# Patient Record
Sex: Female | Born: 1940 | Race: Black or African American | Hispanic: No
Health system: Southern US, Community
[De-identification: ages and names within clinical notes are randomized; demographics above are authoritative.]

---

## 2020-06-17 ENCOUNTER — Other Ambulatory Visit: Payer: Self-pay

## 2020-06-17 ENCOUNTER — Emergency Department (HOSPITAL_COMMUNITY): Payer: Medicare Other

## 2020-06-17 ENCOUNTER — Emergency Department (HOSPITAL_COMMUNITY)
Admission: EM | Admit: 2020-06-17 | Discharge: 2020-06-18 | Disposition: A | Discharge status: Home / Self Care | Payer: Medicare Other | Attending: Emergency Medicine | Admitting: Emergency Medicine

## 2020-06-17 DIAGNOSIS — M7989 Other specified soft tissue disorders: Secondary | ICD-10-CM | POA: Diagnosis present

## 2020-06-17 DIAGNOSIS — R0789 Other chest pain: Secondary | ICD-10-CM | POA: Diagnosis not present

## 2020-06-17 DIAGNOSIS — Z992 Dependence on renal dialysis: Secondary | ICD-10-CM | POA: Diagnosis not present

## 2020-06-17 DIAGNOSIS — Z20822 Contact with and (suspected) exposure to covid-19: Secondary | ICD-10-CM | POA: Insufficient documentation

## 2020-06-17 DIAGNOSIS — R600 Localized edema: Secondary | ICD-10-CM | POA: Diagnosis not present

## 2020-06-17 DIAGNOSIS — Z7901 Long term (current) use of anticoagulants: Secondary | ICD-10-CM | POA: Diagnosis not present

## 2020-06-17 DIAGNOSIS — Z79899 Other long term (current) drug therapy: Secondary | ICD-10-CM | POA: Diagnosis not present

## 2020-06-17 DIAGNOSIS — N186 End stage renal disease: Secondary | ICD-10-CM

## 2020-06-17 LAB — BASIC METABOLIC PANEL
Anion gap: 18 — ABNORMAL HIGH (ref 5–15)
BUN: 97 mg/dL — ABNORMAL HIGH (ref 8–23)
CO2: 28 mmol/L (ref 22–32)
Calcium: 9.1 mg/dL (ref 8.9–10.3)
Chloride: 95 mmol/L — ABNORMAL LOW (ref 98–111)
Creatinine, Ser: 11.28 mg/dL — ABNORMAL HIGH (ref 0.44–1.00)
GFR, Estimated: 3 mL/min — ABNORMAL LOW (ref >60)
Glucose, Bld: 111 mg/dL — ABNORMAL HIGH (ref 70–99)
Potassium: 6.2 mmol/L — ABNORMAL HIGH (ref 3.5–5.1)
Sodium: 141 mmol/L (ref 135–145)

## 2020-06-17 LAB — RESP PANEL BY RT-PCR (FLU A&B, COVID) ARPGX2
Influenza A by PCR: NEGATIVE
Influenza B by PCR: NEGATIVE
SARS Coronavirus 2 by RT PCR: NEGATIVE

## 2020-06-17 LAB — BRAIN NATRIURETIC PEPTIDE: B Natriuretic Peptide: 131 pg/mL — ABNORMAL HIGH (ref 0.0–100.0)

## 2020-06-17 LAB — CBC WITH DIFFERENTIAL/PLATELET
Abs Immature Granulocytes: 0.02 10*3/uL (ref 0.00–0.07)
Basophils Absolute: 0.1 10*3/uL (ref 0.0–0.1)
Basophils Relative: 1 %
Eosinophils Absolute: 0.2 10*3/uL (ref 0.0–0.5)
Eosinophils Relative: 3 %
HCT: 31.7 % — ABNORMAL LOW (ref 36.0–46.0)
Hemoglobin: 10.4 g/dL — ABNORMAL LOW (ref 12.0–15.0)
Immature Granulocytes: 0 %
Lymphocytes Relative: 23 %
Lymphs Abs: 1.6 10*3/uL (ref 0.7–4.0)
MCH: 32.6 pg (ref 26.0–34.0)
MCHC: 32.8 g/dL (ref 30.0–36.0)
MCV: 99.4 fL (ref 80.0–100.0)
Monocytes Absolute: 0.6 10*3/uL (ref 0.1–1.0)
Monocytes Relative: 8 %
Neutro Abs: 4.6 10*3/uL (ref 1.7–7.7)
Neutrophils Relative %: 65 %
Platelets: 260 10*3/uL (ref 150–400)
RBC: 3.19 MIL/uL — ABNORMAL LOW (ref 3.87–5.11)
RDW: 14.8 % (ref 11.5–15.5)
WBC: 7 10*3/uL (ref 4.0–10.5)
nRBC: 0 % (ref 0.0–0.2)

## 2020-06-17 LAB — TROPONIN I (HIGH SENSITIVITY)
Troponin I (High Sensitivity): 22 ng/L — ABNORMAL HIGH (ref <18)
Troponin I (High Sensitivity): 24 ng/L — ABNORMAL HIGH (ref <18)

## 2020-06-17 MED ORDER — CHLORHEXIDINE GLUCONATE CLOTH 2 % EX PADS: 6.0000 | MEDICATED_PAD | Freq: Every day | CUTANEOUS | Status: DC

## 2020-06-17 NOTE — Procedures (Signed)
Asked to see out-of-town ESRD patient for SOB and LE edema, missed HD.  Pt is ESRD x 5 yrs , HD in [place] on MWF schedule, last HD was Wed 12/15. She  states she was set up at [facility] HD unit to start Saturday 12/18 and is staying through the 1st week of January. However when she called the [place] HD unit she says they didn't have any record of her supposed to be dialyzing in that spot. She missed HD yesterday therefore. Today the administrators were involved and they have determined that the patient was indeed supposed to start at the [facility] unit but they have backed up the starting date to this Tuesday 12/21.  She was instructed to come to the ED if she problems in the meantime. Pt describes feeling fullness in her chest and legs, mild nausea. Asked to see for dialysis. Pt examined and has mild edema, ++jvd, mild rales L base, R clear. Not in distress, +facial edema. Plan HD tonight w/ 3-4 L UF and discharge then back to ED so they can reassess for dc home.      I was present at this dialysis session, have reviewed the session itself and made  appropriate changes Kelly Splinter MD Jaconita pager [phone]   06/17/2020, 8:59 PM

## 2020-06-17 NOTE — ED Triage Notes (Signed)
Last had dialysis on Wednesday and isn't scheduled for dialysis again until Tuesday. Staff told her if she started to swell to come to ED. Swelling in both knees. +mild chest tightness and shob.

## 2020-06-17 NOTE — ED Provider Notes (Signed)
[facility] DEPT Provider Note   CSN: [id number] Arrival date & time: 06/17/20  4097     History Chief Complaint  Patient presents with  . Leg Swelling    Donna Gonzalez is a 79 y.o. female.  Patient is here because she has missed multiple rounds of dialysis, she is visiting from out of town, normally gets dialysis Monday Wednesday Friday, she had arranged to get Tuesday Thursday Saturday dialysis here, was most recently dialyzed on Wednesday, due to a mixup logistically she did not receive dialysis on Saturday, she is here today because she started to get some swelling in her legs and mild chest tightness.  She denies any fevers recent injuries recent illnesses.  She has been doing well she has been compliant with her medications.        No past medical history on file.  There are no problems to display for this patient.      OB History   No obstetric history on file.     No family history on file.     Home Medications Prior to Admission medications   Medication Sig Start Date End Date Taking? Authorizing Provider  B Complex-C-Folic Acid (TRIPHROCAPS) 1 MG CAPS Take 1 capsule by mouth daily. 03/28/20   [provider]  calcium acetate (PHOSLO) 667 MG capsule Take by mouth. 04/11/20   [provider]  ELIQUIS 2.5 MG TABS tablet Take 2.5 mg by mouth 2 (two) times daily. 05/18/20   [provider]  folic acid (FOLVITE) 1 MG tablet Take 1 mg by mouth daily. 04/30/20   [provider]  hydrALAZINE (APRESOLINE) 50 MG tablet Take 50 mg by mouth 3 (three) times daily. 05/21/20   [provider]  labetalol (NORMODYNE) 100 MG tablet Take 100 mg by mouth 2 (two) times daily. 05/28/20   [provider]  NIFEdipine (ADALAT CC) 60 MG 24 hr tablet Take by mouth. 03/21/20   [provider]  ondansetron (ZOFRAN) 4 MG tablet Take 4 mg by mouth every 8 (eight) hours as needed for nausea/vomiting.  04/13/20   [provider]  pantoprazole (PROTONIX) 40 MG tablet Take 40 mg by mouth 2 (two) times daily. 02/06/20   [provider]    Allergies    Lisinopril and Pioglitazone  Review of Systems   Review of Systems  Constitutional: Negative for chills and fever.  HENT: Negative for congestion and rhinorrhea.   Respiratory: Positive for chest tightness. Negative for cough and shortness of breath.   Cardiovascular: Positive for leg swelling. Negative for chest pain and palpitations.  Gastrointestinal: Negative for diarrhea, nausea and vomiting.  Genitourinary: Negative for difficulty urinating and dysuria.  Musculoskeletal: Negative for arthralgias and back pain.  Skin: Negative for rash and wound.  Neurological: Negative for light-headedness and headaches.    Physical Exam Updated Vital Signs BP (!) 158/59   Pulse (!) 56   Temp 97.8 F (36.6 C) (Oral)   Resp 20   SpO2 93%   Physical Exam Vitals and nursing note reviewed. Exam conducted with a chaperone present.  Constitutional:      General: She is not in acute distress.    Appearance: Normal appearance.  HENT:     Head: Normocephalic and atraumatic.     Nose: No rhinorrhea.  Eyes:     General:        Right eye: No discharge.        Left eye: No discharge.  Conjunctiva/sclera: Conjunctivae normal.  Cardiovascular:     Rate and Rhythm: Regular rhythm. Bradycardia present.  Pulmonary:     Effort: Pulmonary effort is normal. No respiratory distress.     Breath sounds: No stridor. No wheezing, rhonchi or rales.  Abdominal:     General: Abdomen is flat. There is no distension.     Palpations: Abdomen is soft.  Musculoskeletal:        General: No tenderness or signs of injury.     Right lower leg: Edema (mild) present.     Left lower leg: Edema (mild) present.  Skin:    General: Skin is warm and dry.  Neurological:     General: No focal deficit present.     Mental Status: She is alert. Mental  status is at baseline.     Motor: No weakness.  Psychiatric:        Mood and Affect: Mood normal.        Behavior: Behavior normal.     ED Results / Procedures / Treatments   Labs (all labs ordered are listed, but only abnormal results are displayed) Labs Reviewed  CBC WITH DIFFERENTIAL/PLATELET - Abnormal; Notable for the following components:      Result Value   RBC 3.19 (*)    Hemoglobin 10.4 (*)    HCT 31.7 (*)    All other components within normal limits  BASIC METABOLIC PANEL - Abnormal; Notable for the following components:   Potassium 6.2 (*)    Chloride 95 (*)    Glucose, Bld 111 (*)    BUN 97 (*)    Creatinine, Ser 11.28 (*)    GFR, Estimated 3 (*)    Anion gap 18 (*)    All other components within normal limits  BRAIN NATRIURETIC PEPTIDE - Abnormal; Notable for the following components:   B Natriuretic Peptide 131.0 (*)    All other components within normal limits  TROPONIN I (HIGH SENSITIVITY) - Abnormal; Notable for the following components:   Troponin I (High Sensitivity) 22 (*)    All other components within normal limits  RESP PANEL BY RT-PCR (FLU A&B, COVID) ARPGX2  TROPONIN I (HIGH SENSITIVITY)    EKG EKG Interpretation  Date/Time:  Sunday June 17 2020 09:58:28 EST Ventricular Rate:  56 PR Interval:    QRS Duration: 151 QT Interval:  474 QTC Calculation: 458 R Axis:   -48 Text Interpretation: Sinus or ectopic atrial rhythm 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 FINDINGS: The cardiomediastinal silhouette is mildly enlarged in contour.Atherosclerotic calcifications of the aorta. LEFT chest cardiac pacing device. No pleural effusion. No pneumothorax. Perihilar vascular fullness and cephalization of the vasculature without overt  edema. Visualized abdomen is unremarkable. Multilevel degenerative changes of the thoracic spine. IMPRESSION: Findings suggestive of pulmonary vascular congestion without overt edema. Electronically Signed   By: Valentino Saxon MD   On: 06/17/2020 11:04    Procedures Procedures (including critical care time)  Medications Ordered in ED Medications  Chlorhexidine Gluconate Cloth 2 % PADS 6 each (has no administration in time range)    ED Course  I have reviewed the triage vital signs and the nursing notes.  Pertinent labs & imaging results that were available during my care of the patient were reviewed by me and  considered in my medical decision making (see chart for details).    MDM Rules/Calculators/A&P                          Patient with end-stage renal disease comes today for missed dialysis clinic.  Upon initial assessment patient has normal work of breathing has clear lung sounds no signs of volume overload in the lungs, faint to mild pitting edema in the lower extremities, she is breathing in a comfortable rate, she has no signs of hypoxia.  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  She denies any chest pain but does feel a little bit of tightness that she thinks is from missing dialysis sessions, we will get screening labs she will get cardiac biomarkers will get chest x-ray we will touch base with nephrology to see if we can help arrange this patient to get dialyzed.  I reviewed this patient's labs elevated creatinine BUN and potassium, no EKG changes so no temporizing measures needed.  I spoke to nephrologist on-call Dr. Jonnie Finner, Dr. Jonnie Finner agrees this patient does need to be dialyzed, he has space for her at the dialysis center today at 6 PM, he recommends transfer to [facility] where the dialysis center is so the patient  be dialyzed soon as possible and then likely be discharged home.  Family agrees to this plan.  I spoke to the [facility] emergency department, Dr. Alvino Chapel is on-call there agrees to except this patient  Final Clinical Impression(s) / ED Diagnoses Final diagnoses:  ESRD (end stage renal disease) [facility])    Rx / DC Orders ED Discharge Orders    None       Breck Coons, MD 06/17/20 1249

## 2020-06-17 NOTE — ED Triage Notes (Signed)
Pt transferred from [facility] via [facility] for dialysis needed tonight. Pt started feeling really tight in legs and chest today, felt fluid overloaded.   A&Ox4

## 2020-06-17 NOTE — ED Provider Notes (Signed)
[facility] EMERGENCY DEPARTMENT Provider Note   CSN: [id number] Arrival date & time: 06/17/20  2774     History Chief Complaint  Patient presents with  . Leg Swelling    Donna Gonzalez is a 79 y.o. female with history of ESRD on dialysis M/W/F who presents to the ED from [facility] ED for emergent dialysis. Patient is visiting from out of town and missed dialysis on Saturday. Last dialysis session Wednesday. She presented to [facility] ED due to increased lower extremity edema and chest tightness which she attributes to missed dialysis. Denies fever and chills. No recent illness. During my evaluation she has no more chest tightness. Denies history of MI.   History obtained from patient and past medical records. No interpreter used during encounter.      No past medical history on file.  There are no problems to display for this patient.    OB History   No obstetric history on file.     No family history on file.     Home Medications Prior to Admission medications   Medication Sig Start Date End Date Taking? Authorizing Provider  B Complex-C-Folic Acid (TRIPHROCAPS) 1 MG CAPS Take 1 capsule by mouth daily. 03/28/20   [provider]  calcium acetate (PHOSLO) 667 MG capsule Take by mouth. 04/11/20   [provider]  ELIQUIS 2.5 MG TABS tablet Take 2.5 mg by mouth 2 (two) times daily. 05/18/20   [provider]  folic acid (FOLVITE) 1 MG tablet Take 1 mg by mouth daily. 04/30/20   [provider]  hydrALAZINE (APRESOLINE) 50 MG tablet Take 50 mg by mouth 3 (three) times daily. 05/21/20   [provider]  labetalol (NORMODYNE) 100 MG tablet Take 100 mg by mouth 2 (two) times daily. 05/28/20   [provider]  NIFEdipine (ADALAT CC) 60 MG 24 hr tablet Take by mouth. 03/21/20   [provider]  ondansetron (ZOFRAN) 4 MG tablet Take 4 mg by mouth every 8 (eight) hours as needed for nausea/vomiting. 04/13/20    [provider]  pantoprazole (PROTONIX) 40 MG tablet Take 40 mg by mouth 2 (two) times daily. 02/06/20   [provider]    Allergies    Lisinopril and Pioglitazone  Review of Systems   Review of Systems  Constitutional: Negative for chills and fever.  Respiratory: Positive for chest tightness. Negative for shortness of breath.   Cardiovascular: Positive for leg swelling. Negative for palpitations.  Gastrointestinal: Negative for abdominal pain, diarrhea, nausea and vomiting.  All other systems reviewed and are negative.   Physical Exam Updated Vital Signs BP (!) 177/67   Pulse (!) 55   Temp 97.8 F (36.6 C) (Oral)   Resp 20   SpO2 93%   Physical Exam Vitals and nursing note reviewed.  Constitutional:      General: She is not in acute distress.    Appearance: She is not toxic-appearing.  HENT:     Head: Normocephalic.  Eyes:     Pupils: Pupils are equal, round, and reactive to light.  Cardiovascular:     Rate and Rhythm: Regular rhythm. Bradycardia present.     Pulses: Normal pulses.     Heart sounds: Normal heart sounds. No murmur heard. No friction rub. No gallop.   Pulmonary:     Effort: Pulmonary effort is normal.     Breath sounds: Normal breath sounds.  Abdominal:     General: Abdomen is flat. Bowel sounds  are normal. There is no distension.     Palpations: Abdomen is soft.     Tenderness: There is no abdominal tenderness. There is no guarding or rebound.  Musculoskeletal:     Cervical back: Neck supple.     Right lower leg: Edema present.     Left lower leg: Edema present.  Skin:    General: Skin is warm and dry.  Neurological:     General: No focal deficit present.  Psychiatric:        Mood and Affect: Mood normal.        Behavior: Behavior normal.     ED Results / Procedures / Treatments   Labs (all labs ordered are listed, but only abnormal results are displayed) Labs Reviewed  CBC WITH DIFFERENTIAL/PLATELET - Abnormal;  Notable for the following components:      Result Value   RBC 3.19 (*)    Hemoglobin 10.4 (*)    HCT 31.7 (*)    All other components within normal limits  BASIC METABOLIC PANEL - Abnormal; Notable for the following components:   Potassium 6.2 (*)    Chloride 95 (*)    Glucose, Bld 111 (*)    BUN 97 (*)    Creatinine, Ser 11.28 (*)    GFR, Estimated 3 (*)    Anion gap 18 (*)    All other components within normal limits  BRAIN NATRIURETIC PEPTIDE - Abnormal; Notable for the following components:   B Natriuretic Peptide 131.0 (*)    All other components within normal limits  TROPONIN I (HIGH SENSITIVITY) - Abnormal; Notable for the following components:   Troponin I (High Sensitivity) 22 (*)    All other components within normal limits  TROPONIN I (HIGH SENSITIVITY) - Abnormal; Notable for the following components:   Troponin I (High Sensitivity) 24 (*)    All other components within normal limits  RESP PANEL BY RT-PCR (FLU A&B, COVID) ARPGX2    EKG EKG Interpretation  Date/Time:  Sunday June 17 2020 09:58:28 EST Ventricular Rate:  56 PR Interval:    QRS Duration: 151 QT Interval:  474 QTC Calculation: 458 R Axis:   -48 Text Interpretation: Sinus or ectopic atrial rhythm 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 FINDINGS: The cardiomediastinal silhouette is mildly enlarged in contour.Atherosclerotic calcifications of the aorta. LEFT chest cardiac pacing device. No pleural effusion. No pneumothorax. Perihilar vascular fullness and cephalization of the vasculature without overt edema. Visualized abdomen is unremarkable. Multilevel degenerative changes of the thoracic spine. IMPRESSION: Findings suggestive of pulmonary vascular congestion without overt edema.  Electronically Signed   By: Stephanie  Peacock MD   On: 06/17/2020 11:04    Procedures Procedures (including critical care time)  Medications Ordered in ED Medications  Chlorhexidine Gluconate Cloth 2 % PADS 6 each (has no administration in time range)    ED Course  I have reviewed the triage vital signs and the nursing notes.  Pertinent labs & imaging results that were available during my care of the patient were reviewed by me and considered in my medical decision making (see chart for details).    MDM Rules/Calculators/A&P                         79  year old  female presents the ED from [facility] long ED for emergent dialysis. Patient is visiting from out of town. Last dialysis session was on Wednesday. Admits to lower extremity edema and chest tightness earlier this morning which has completely resolved by my evaluation.  Stable vitals with bradycardia.  Patient has a pacemaker and notes this is typical of her heart rate.  Patient in no acute distress and nontoxic appearing.  Physical exam significant for 1+ bilateral lower extremity edema.  Negative calf tenderness.  Negative Homans' sign bilaterally.  Lungs clear to auscultation bilaterally.  I personally reviewed all labs and imaging from previous provider.  Troponins elevated mildly likely due to ESRD. Patient is currently chest pain free. EKG with no signs of acute ischemia. Low suspicion for ACS.  Mildly elevated at 131.  Covid/influenza test negative.  BMP significant for elevated creatinine and BUN likely due to missed dialysis.  Hyperkalemia at 6.2. No EKG changes. CXR personally reviewed which demonstrates: IMPRESSION:  Findings suggestive of pulmonary vascular congestion without overt  edema.   CBC reassuring with no leukocytosis.  Mild anemia with hemoglobin at 10.4 likely due to ESRD.  4:54 PM informed Dr. Jonnie Finner that patient has arrived from [facility]. He will see patient soon. Plan for dialysis around 6PM this evening.    Patient taken to dialysis and will be brought to the ED after for reevaluation. Patient will need to be reassessed for symptomatic relief. Anticipate discharge home after dialysis.  Final Clinical Impression(s) / ED Diagnoses Final diagnoses:  ESRD (end stage renal disease) [facility])    Rx / DC Orders ED Discharge Orders    None       Karie Kirks 06/17/20 2021    Charlesetta Shanks, MD 06/21/20 1129

## 2020-06-18 NOTE — ED Notes (Signed)
Discharge instructions discussed with pt. Pt verbalized understanding. Pt stable and ambulatory. No signature pad available. 

## 2020-06-18 NOTE — ED Provider Notes (Signed)
12:54 AM Assumed care from Drs. Ron Parker and Johnney Killian and PA Aberman, please see their note for full history, physical and decision making until this point. In brief this is a 79 y.o. year old female who presented to the ED tonight with Leg Swelling     Was here with lower extremity edema.  Went to dialysis.  Patient feels better.  Blood pressure still high but she has taking her blood pressure medicines today so will likely get better.  She already has dialysis scheduled for the rest of her stay here in town.  She appears to be stable for discharge on my examination at this time.  No respiratory distress.  Discharge instructions, including strict return precautions for new or worsening symptoms, given. Patient and/or family verbalized understanding and agreement with the plan as described.   Labs, studies and imaging reviewed by myself and considered in medical decision making if ordered. Imaging interpreted by radiology.  Labs Reviewed  CBC WITH DIFFERENTIAL/PLATELET - Abnormal; Notable for the following components:      Result Value   RBC 3.19 (*)    Hemoglobin 10.4 (*)    HCT 31.7 (*)    All other components within normal limits  BASIC METABOLIC PANEL - Abnormal; Notable for the following components:   Potassium 6.2 (*)    Chloride 95 (*)    Glucose, Bld 111 (*)    BUN 97 (*)    Creatinine, Ser 11.28 (*)    GFR, Estimated 3 (*)    Anion gap 18 (*)    All other components within normal limits  BRAIN NATRIURETIC PEPTIDE - Abnormal; Notable for the following components:   B Natriuretic Peptide 131.0 (*)    All other components within normal limits  TROPONIN I (HIGH SENSITIVITY) - Abnormal; Notable for the following components:   Troponin I (High Sensitivity) 22 (*)    All other components within normal limits  TROPONIN I (HIGH SENSITIVITY) - Abnormal; Notable for the following components:   Troponin I (High Sensitivity) 24 (*)    All other components within normal limits  RESP PANEL BY  RT-PCR (FLU A&B, COVID) ARPGX2    DG Chest 2 View  Final Result      No follow-ups on file.    Jaxden Blyden, Corene Cornea, MD 06/18/20 [phone]

## 2022-03-01 IMAGING — CR DG CHEST 2V
2 series · 2 of 2 positions shown · non-contrast
Comparison: None.

CLINICAL DATA: Chest tightness

EXAM:
CHEST - 2 VIEW

[w chest lat]
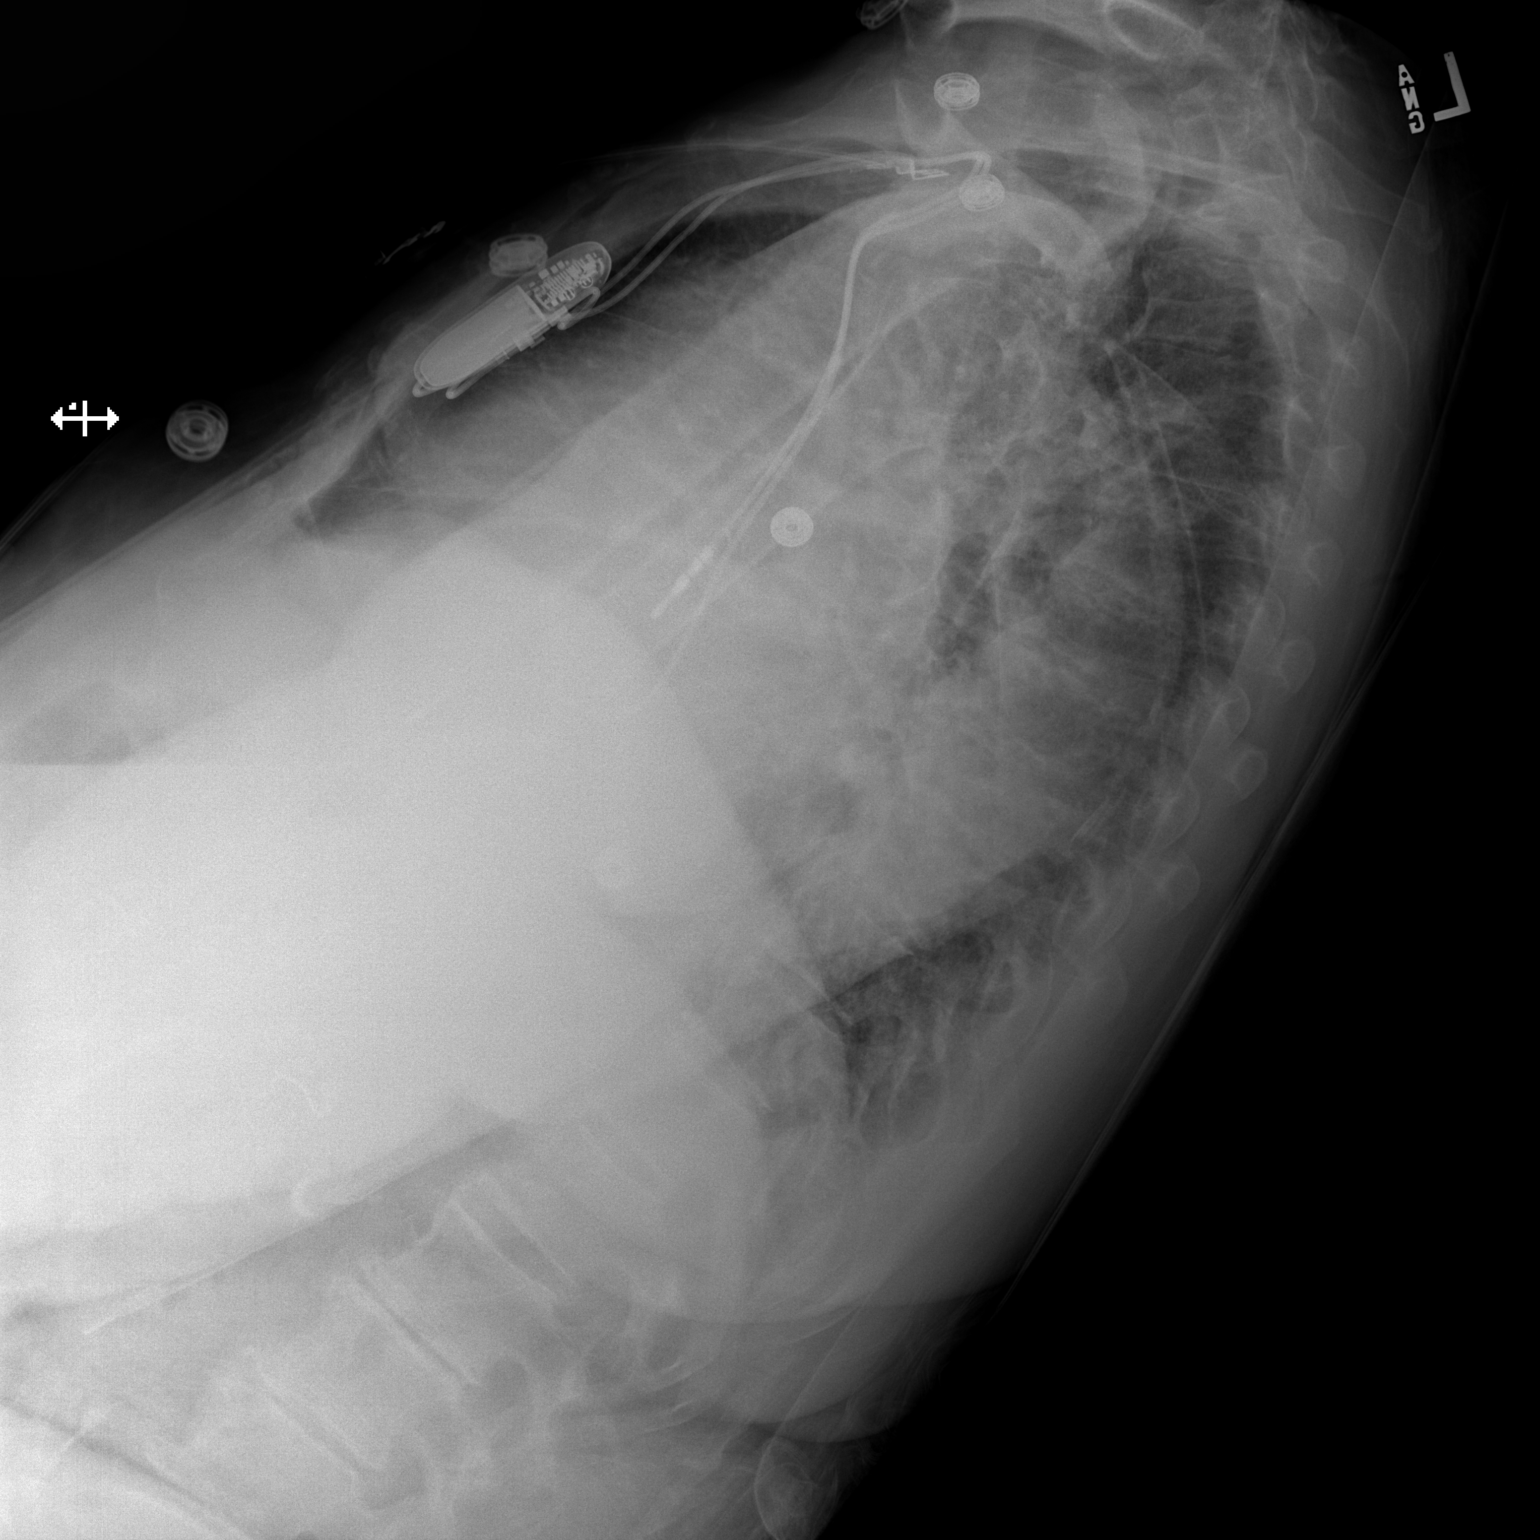

[x chest ap]
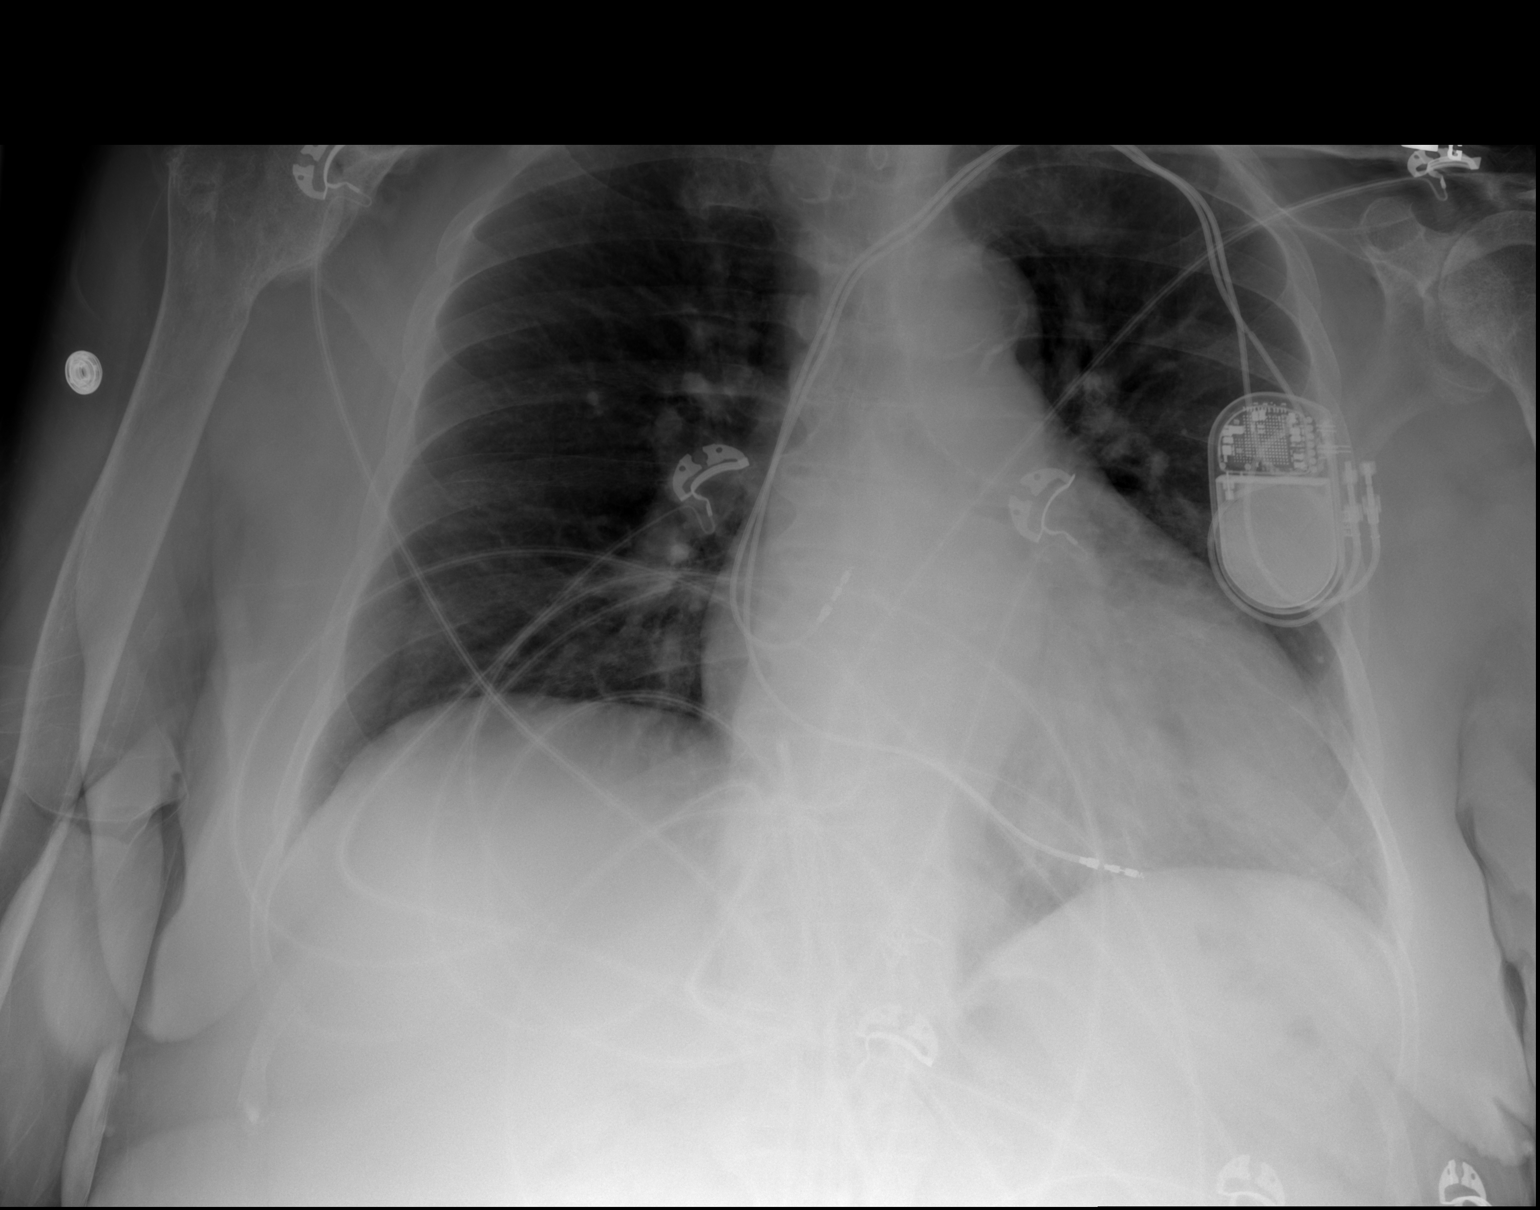

[2 of 2 positions shown; findings below may reference images not displayed]

FINDINGS: The cardiomediastinal silhouette is mildly enlarged in
contour.Atherosclerotic calcifications of the aorta. LEFT chest
cardiac pacing device. No pleural effusion. No pneumothorax.
Perihilar vascular fullness and cephalization of the vasculature
without overt edema. Visualized abdomen is unremarkable. Multilevel
degenerative changes of the thoracic spine.
IMPRESSION: Findings suggestive of pulmonary vascular congestion without overt
edema.

## 2024-04-30 DEATH — deceased
